# Patient Record
Sex: Male | Born: 2000 | Race: Black or African American | Hispanic: No | Marital: Single | Smoking: Never smoker
Health system: Southern US, Community
[De-identification: ages and names within clinical notes are randomized; demographics above are authoritative.]

---
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
## 2022-06-04 ENCOUNTER — Emergency Department (HOSPITAL_COMMUNITY): Admission: EM | Admit: 2022-06-04 | Discharge: 2022-06-04 | Discharge status: Against Medical Advice | Payer: Self-pay

## 2022-06-04 NOTE — ED Triage Notes (Signed)
Pt called x3 with no response. Not visualized in waiting area or restrooms.
